# Patient Record
Sex: Female | Born: 1975 | Race: White | Hispanic: Yes | Marital: Married | Smoking: Never smoker
Health system: Southern US, Community
[De-identification: ages and names within clinical notes are randomized; demographics above are authoritative.]

---

## 2021-09-09 ENCOUNTER — Other Ambulatory Visit: Payer: Self-pay

## 2021-09-09 ENCOUNTER — Ambulatory Visit
Admission: RE | Admit: 2021-09-09 | Discharge: 2021-09-09 | Disposition: A | Discharge status: Home / Self Care | Payer: 59 | Source: Ambulatory Visit | Attending: Emergency Medicine | Admitting: Emergency Medicine

## 2021-09-09 VITALS — BP 127/78 | HR 69 | Temp 98.6°F | Resp 18

## 2021-09-09 DIAGNOSIS — R350 Frequency of micturition: Secondary | ICD-10-CM | POA: Insufficient documentation

## 2021-09-09 DIAGNOSIS — N3000 Acute cystitis without hematuria: Secondary | ICD-10-CM | POA: Diagnosis present

## 2021-09-09 LAB — POCT URINALYSIS DIP (MANUAL ENTRY)
Bilirubin, UA: NEGATIVE
Glucose, UA: NEGATIVE mg/dL
Ketones, POC UA: NEGATIVE mg/dL
Leukocytes, UA: NEGATIVE
Nitrite, UA: POSITIVE — AB
Protein Ur, POC: NEGATIVE mg/dL
Spec Grav, UA: 1.01 (ref 1.010–1.025)
Urobilinogen, UA: 0.2 E.U./dL
pH, UA: 5.5 (ref 5.0–8.0)

## 2021-09-09 MED ORDER — SULFAMETHOXAZOLE-TRIMETHOPRIM 800-160 MG PO TABS: 1.0000 | ORAL_TABLET | Freq: Two times a day (BID) | ORAL | 0 refills | Status: AC

## 2021-09-09 MED ORDER — FLUCONAZOLE 150 MG PO TABS: ORAL_TABLET | ORAL | 0 refills | Status: AC

## 2021-09-09 NOTE — ED Provider Notes (Signed)
?[facility] ? ? ? ?CSN: [id number] ?Arrival date & time: 09/09/21  1016 ?  ? ?HISTORY  ? ?Chief Complaint  ?Patient presents with  ? Urinary Frequency  ? ?HPI ?Tiffany Wheeler is a 46 y.o. female. Pt c/o normal urinary frequency with significantly decreased quantity of urine output along with weak urine stream.  Patient states she is also has some discomfort around her bladder.  Patient states she took a dose of  Phenazopyridine hydrochloride last night she did improve her urine stream a little bit but states today she still feels that her urine is dark and that she is not urinating as forcefully as normal.  Patient states she has had urinary tract infections in the past with similar initial symptoms.  Patient reports symptoms began 2 days ago.  Patient denies fever, aches, chills, nausea, vomiting, diarrhea, genital lesions, burning with urination, pelvic pain, pelvic pressure, dyspareunia, abnormal vaginal discharge. ? ? ? ?Past Medical History:  ?Diagnosis Date  ? H/O tubal ligation 2022  ? ?There are no problems to display for this patient. ? ?History reviewed. No pertinent surgical history. ?OB History   ?No obstetric history on file. ?  ? ?Home Medications   ? ?Prior to Admission medications   ?Not on File  ? ?Family History ?History reviewed. No pertinent family history. ?Social History ?Social History  ? ?Tobacco Use  ? Smoking status: Never  ? Smokeless tobacco: Never  ?Vaping Use  ? Vaping Use: Never used  ?Substance Use Topics  ? Alcohol use: Yes  ?  Comment: sometimes  ? Drug use: Never  ? ?Allergies   ?Patient has no allergy information on record. ? ?Review of Systems ?Review of Systems ?Pertinent findings noted in history of present illness.  ? ?Physical Exam ?Triage Vital Signs ?ED Triage Vitals  ?Enc Vitals Group  ?   BP 04/23/21 0827 (!) 147/82  ?   Pulse Rate 04/23/21 0827 72  ?   Resp 04/23/21 0827 18  ?   Temp 04/23/21 0827 98.3 ?F (36.8 ?C)  ?   Temp Source 04/23/21 0827 Oral  ?    SpO2 04/23/21 0827 98 %  ?   Weight --   ?   Height --   ?   Head Circumference --   ?   Peak Flow --   ?   Pain Score 04/23/21 0826 5  ?   Pain Loc --   ?   Pain Edu? --   ?   Excl. in GC? --   ?No data found. ? ?Updated Vital Signs ?BP 127/78 (BP Location: Left Arm)   Pulse 69   Temp 98.6 ?F (37 ?C) (Oral)   Resp 18   SpO2 96%  ? ?Physical Exam ?Vitals and nursing note reviewed.  ?Constitutional:   ?   General: She is not in acute distress. ?   Appearance: Normal appearance. She is not ill-appearing.  ?HENT:  ?   Head: Normocephalic and atraumatic.  ?Eyes:  ?   General: Lids are normal.     ?   Right eye: No discharge.     ?   Left eye: No discharge.  ?   Extraocular Movements: Extraocular movements intact.  ?   Conjunctiva/sclera: Conjunctivae normal.  ?   Right eye: Right conjunctiva is not injected.  ?   Left eye: Left conjunctiva is not injected.  ?Neck:  ?   Trachea: Trachea and phonation normal.  ?Cardiovascular:  ?  Rate and Rhythm: Normal rate and regular rhythm.  ?   Pulses: Normal pulses.  ?   Heart sounds: Normal heart sounds. No murmur heard. ?  No friction rub. No gallop.  ?Pulmonary:  ?   Effort: Pulmonary effort is normal. No accessory muscle usage, prolonged expiration or respiratory distress.  ?   Breath sounds: Normal breath sounds. No stridor, decreased air movement or transmitted upper airway sounds. No decreased breath sounds, wheezing, rhonchi or rales.  ?Chest:  ?   Chest wall: No tenderness.  ?Abdominal:  ?   General: Abdomen is flat. Bowel sounds are normal. There is no distension.  ?   Palpations: Abdomen is soft.  ?   Tenderness: There is abdominal tenderness in the suprapubic area. There is no right CVA tenderness or left CVA tenderness.  ?   Hernia: No hernia is present.  ?Musculoskeletal:     ?   General: Normal range of motion.  ?   Cervical back: Normal range of motion and neck supple. Normal range of motion.  ?Lymphadenopathy:  ?   Cervical: No cervical adenopathy.  ?Skin: ?    General: Skin is warm and dry.  ?   Findings: No erythema or rash.  ?Neurological:  ?   General: No focal deficit present.  ?   Mental Status: She is alert and oriented to person, place, and time.  ?Psychiatric:     ?   Mood and Affect: Mood normal.     ?   Behavior: Behavior normal.  ? ? ?Visual Acuity ?Right Eye Distance:   ?Left Eye Distance:   ?Bilateral Distance:   ? ?Right Eye Near:   ?Left Eye Near:    ?Bilateral Near:    ? ?UC Couse / Diagnostics / Procedures:  ?  ?EKG ? ?Radiology ?No results found. ? ?Procedures ?Procedures (including critical care time) ? ?UC Diagnoses / Final Clinical Impressions(s)   ?I have reviewed the triage vital signs and the nursing notes. ? ?Pertinent labs & imaging results that were available during my care of the patient were reviewed by me and considered in my medical decision making (see chart for details).   ? ?Final diagnoses:  ?Increased frequency of urination  ?Acute cystitis without hematuria  ? ? ? ?Urine dip today was positive for nitrites.  Urine culture will be performed per our protocol.   ?Patient was advised to begin antibiotics now due to findings on urine dip. ?Patient was advised to begin antibiotics today due to having active symptoms of urinary tract infection.                    ?Patient was advised to take all doses exactly as prescribed.  Patient also advised of risks of worsening infection with incomplete antibiotic therapy. ?Patient advised that they will be contacted with results and that adjustments to treatment will be provided as indicated based on the results.   Patient was advised of possibility that urine culture results may be negative if sample provided was obtained late in the day causing urine to be more diluted.  Patient was advised that if antibiotics were effective after the first 24 to 36 hours, despite negative urine culture result, it is recommended that they complete the full course as prescribed.   ?Patient advised that they will be  contacted with results of the urine culture and that treatment will be provided as indicated based on results. ?Diflucan prescribed for inevitable vaginal yeast infection caused by antibiotic  therapy. ?Return precautions advised. ? ?ED Prescriptions   ? ? Medication Sig Dispense Auth. Provider  ? sulfamethoxazole-trimethoprim (BACTRIM DS) 800-160 MG tablet Take 1 tablet by mouth 2 (two) times daily for 5 days. 10 tablet Theadora Rama Scales, PA-C  ? fluconazole (DIFLUCAN) 150 MG tablet Take 1 tablet on day 4 of antibiotics.  Take second tablet 3 days later. 2 tablet Theadora Rama Scales, PA-C  ? ?  ? ?PDMP not reviewed this encounter. ? ?Pending results:  ?Labs Reviewed  ?POCT URINALYSIS DIP (MANUAL ENTRY) - Abnormal; Notable for the following components:  ?    Result Value  ? Color, UA orange (*)   ? Blood, UA trace-lysed (*)   ? Nitrite, UA Positive (*)   ? All other components within normal limits  ?URINE CULTURE  ? ? ?Medications Ordered in UC: ?Medications - No data to display ? ?Disposition Upon Discharge:  ?Condition: stable for discharge home ? ?Patient presented with concern for an acute illness with associated systemic symptoms and significant discomfort requiring urgent management. In my opinion, this is a condition that a prudent lay person (someone who possesses an average knowledge of health and medicine) may potentially expect to result in complications if not addressed urgently such as respiratory distress, impairment of bodily function or dysfunction of bodily organs.  ? ?As such, the patient has been evaluated and assessed, work-up was performed and treatment was provided in alignment with urgent care protocols and evidence based medicine.  Patient/parent/caregiver has been advised that the patient may require follow up for further testing and/or treatment if the symptoms continue in spite of treatment, as clinically indicated and appropriate. ? ?Routine symptom specific, illness specific  and/or disease specific instructions were discussed with the patient and/or caregiver at length.  Prevention strategies for avoiding STD exposure were also discussed. ? ?The patient will follow up with their c

## 2021-09-09 NOTE — Discharge Instructions (Addendum)
El an?lisis de orina que realizamos en la cl?nica hoy fue anormal. El cultivo de orina se Education officer, environmental? seg?n Science writer. El resultado del urocultivo estar? disponible en los pr?ximos 3 a 5 d?as y se publicar? en su cuenta MyChart. Si hay un hallazgo anormal, se comunicar? con usted por tel?fono y se le informar? sobre recomendaciones de tratamiento adicionales, si corresponde.  ? ?Se le aconsej? que comenzara con los antibi?ticos hoy porque tiene s?ntomas activos de una infecci?n del tracto urinario. He enviado una receta para Bactrim, un antibi?tico com?nmente utilizado para tratar infecciones del tracto urinario. Tome 1 tableta dos veces al d?a durante 5 d?as completos.  ? ?Es muy importante que tome todas las dosis exactamente seg?n lo prescrito. La terapia antibi?tica incompleta puede causar el empeoramiento de la infecci?n del tracto urinario que puede volverse agresiva, Barista el nivel de los ri?ones causando infecci?n renal y posible hospitalizaci?n.  ? ?Si recibe una llamada telef?nica que le informa que su cultivo de orina es negativo pero se siente significativamente mejor despu?s de Games developer con los antibi?ticos, le recomiendo que complete el ciclo completo de antibi?ticos seg?n lo prescrito.  ? ?Si recibe una llamada telef?nica que le informa que su cultivo de orina es negativo y que no se siente significativamente mejor despu?s de Games developer con los antibi?ticos, no dude en suspenderlos, ya que ya no est?n indicados.  ? ?Debido a que sabemos que el tratamiento con antibi?ticos a menudo puede causar infecciones vaginales por hongos, tambi?n le he proporcionado una receta para fluconazol (Diflucan). Tome la primera tableta el tercer d?a de sus antibi?ticos y tome la segunda tableta dos d?as despu?s de la primera tableta.  ? ?Si no ha tenido Training and development officer de sus s?ntomas despu?s de Advertising account planner seg?n lo prescrito, regrese a la atenci?n de urgencia para repetir la evaluaci?n o el  seguimiento con su proveedor de atenci?n primaria.  ? ?Gracias por visitar la atenci?n de urgencia hoy. Agradezco la oportunidad de participar en su atenci?n.  ? ? ? ?The urinalysis that we performed in the clinic today was abnormal.  Urine culture will be performed per our protocol.  The result of the urine culture will be available in the next 3 to 5 days and will be posted to your MyChart account.  If there is an abnormal finding, you will be contacted by phone and advised of further treatment recommendations, if any. ?  ?You were advised to begin antibiotics today because you are having active symptoms of a urinary tract infection.  I have sent a prescription for Bactrim, an antibiotic commonly used to treat urinary tract infections.  Please take 1 tablet twice daily for a full 5 days.   ? ?It is very important that you take all doses exactly as prescribed.  Incomplete antibiotic therapy can cause worsening urinary tract infection that can become aggressive, reach the level of your kidneys causing kidney infection and possible hospitalization. ?  ?If you receive a phone call advising you that your urine culture is negative but you are feeling significantly better after starting antibiotics, I recommend that you complete the full course of antibiotics as prescribed.   ?  ?If you receive a phone call advising you that your urine culture is negative and you are not feeling significantly better after starting antibiotics, please feel free to discontinue antibiotics as they are no longer indicated. ?  ?Because we know that antibiotic treatment can often cause vaginal yeast infections, I have also provided you with  a prescription for fluconazole (Diflucan).  Please take the first tablet on the third day of your antibiotics and take the second tablet two days after the first tablet. ?  ?If you have not had complete resolution of your symptoms after completing treatment as prescribed, please return to urgent care for  repeat evaluation or follow-up with your primary care provider. ?  ?Thank you for visiting urgent care today.  I appreciate the opportunity to participate in your care. ? ?

## 2021-09-09 NOTE — ED Triage Notes (Addendum)
Pt c/o urinary frequency with small amounts, and bladder pain. Patient reports taking a dose of Phenazopyridine hydrochloride last night. ?Started: 2 days ago ?

## 2021-09-10 LAB — URINE CULTURE: Culture: NO GROWTH

## 2021-09-30 ENCOUNTER — Telehealth: Payer: 59 | Admitting: Physician Assistant

## 2021-09-30 DIAGNOSIS — M722 Plantar fascial fibromatosis: Secondary | ICD-10-CM

## 2021-09-30 MED ORDER — METHYLPREDNISOLONE 4 MG PO TBPK: ORAL_TABLET | ORAL | 0 refills | Status: AC

## 2021-09-30 NOTE — Progress Notes (Signed)
?Virtual Visit Consent  ? ?Tiffany Wheeler, you are scheduled for a virtual visit with a [facility] provider today.   ?  ?Just as with appointments in the office, your consent must be obtained to participate.  Your consent will be active for this visit and any virtual visit you may have with one of our providers in the next 365 days.   ?  ?If you have a MyChart account, a copy of this consent can be sent to you electronically.  All virtual visits are billed to your insurance company just like a traditional visit in the office.   ? ?As this is a virtual visit, video technology does not allow for your provider to perform a traditional examination.  This may limit your provider's ability to fully assess your condition.  If your provider identifies any concerns that need to be evaluated in person or the need to arrange testing (such as labs, EKG, etc.), we will make arrangements to do so.   ?  ?Although advances in technology are sophisticated, we cannot ensure that it will always work on either your end or our end.  If the connection with a video visit is poor, the visit may have to be switched to a telephone visit.  With either a video or telephone visit, we are not always able to ensure that we have a secure connection.    ? ?I need to obtain your verbal consent now.   Are you willing to proceed with your visit today?  ?  ?Tiffany Wheeler has provided verbal consent on 09/30/2021 for a virtual visit (video or telephone). ?  ?Margaretann Loveless, PA-C  ? ?Date: 09/30/2021 5:22 PM ? ? ?Virtual Visit via Video Note  ? ?Tiffany Wheeler, connected with  Cherl Gorney  ([id number], Sep 18, 1975) on 09/30/21 at  5:00 PM EDT by a video-enabled telemedicine application and verified that I am speaking with the correct person using two identifiers. ? ?Location: ?Patient: Virtual Visit Location Patient: Home ?Provider: Virtual Visit Location Provider: Home Office ?Spanish Intepreter [phone] ? ?  ?I discussed  the limitations of evaluation and management by telemedicine and the availability of in person appointments. The patient expressed understanding and agreed to proceed.   ? ?History of Present Illness: ?Tiffany Wheeler is a 46 y.o. who identifies as a female who was assigned female at birth, and is being seen today for having pain in right plantar fascia. Has had a long time, over 2 years. Has had cortisone injection that helped about 60%. Now having more pain. Feels heel is going to break when she puts weight on the foot.  Altering gait enough that she is starting to have knee pain. Was prescribed Ibuprofen that is not helping as much. Has tried naprosyn, acetaminophen. Has tried orthotics as well. Last seen podiatry in 04/2021, but has had insurance change and requires a new referral.  ? ? ?Problems: There are no problems to display for this patient. ?  ?Allergies: No Known Allergies ?Medications:  ?Current Outpatient Medications:  ?  methylPREDNISolone (MEDROL DOSEPAK) 4 MG TBPK tablet, 6 day taper; Take as directed on package instructions, Disp: 21 tablet, Rfl: 0 ?  fluconazole (DIFLUCAN) 150 MG tablet, Take 1 tablet on day 4 of antibiotics.  Take second tablet 3 days later., Disp: 2 tablet, Rfl: 0 ? ?Observations/Objective: ?Patient is well-developed, well-nourished in no acute distress.  ?Resting comfortably at home.  ?Head is normocephalic, atraumatic.  ?No labored breathing.  ?  Speech is clear and coherent with logical content.  ?Patient is alert and oriented at baseline.  ? ? ?Assessment and Plan: ?1. Plantar fasciitis of right foot ?- methylPREDNISolone (MEDROL DOSEPAK) 4 MG TBPK tablet; 6 day taper; Take as directed on package instructions  Dispense: 21 tablet; Refill: 0 ? ?- Will give medrol for pain and inflammation ?- Continue to use orthotic for shoe ?- Advised we were unable to place referrals through our department and this would need to be done by her PCP ? ?Follow Up Instructions: ?I discussed  the assessment and treatment plan with the patient. The patient was provided an opportunity to ask questions and all were answered. The patient agreed with the plan and demonstrated an understanding of the instructions.  A copy of instructions were sent to the patient via MyChart unless otherwise noted below.  ? ? ?The patient was advised to call back or seek an in-person evaluation if the symptoms worsen or if the condition fails to improve as anticipated. ? ?Time:  ?I spent 25 minutes with the patient via telehealth technology discussing the above problems/concerns.   ? ?Margaretann Loveless, PA-C ?

## 2021-09-30 NOTE — Patient Instructions (Signed)
?Wynonia Hazard, thank you for joining Margaretann Loveless, PA-C for today's virtual visit.  While this provider is not your primary care provider (PCP), if your PCP is located in our provider database this encounter information will be shared with them immediately following your visit. ? ?Consent: ?(Patient) Tiffany Wheeler provided verbal consent for this virtual visit at the beginning of the encounter. ? ?Current Medications: ? ?Current Outpatient Medications:  ?  methylPREDNISolone (MEDROL DOSEPAK) 4 MG TBPK tablet, 6 day taper; Take as directed on package instructions, Disp: 21 tablet, Rfl: 0 ?  fluconazole (DIFLUCAN) 150 MG tablet, Take 1 tablet on day 4 of antibiotics.  Take second tablet 3 days later., Disp: 2 tablet, Rfl: 0  ? ?Medications ordered in this encounter:  ?Meds ordered this encounter  ?Medications  ? methylPREDNISolone (MEDROL DOSEPAK) 4 MG TBPK tablet  ?  Sig: 6 day taper; Take as directed on package instructions  ?  Dispense:  21 tablet  ?  Refill:  0  ?  Order Specific Question:   Supervising Provider  ?  Answer:   Eber Hong [[id number]]  ?  ? ?*If you need refills on other medications prior to your next appointment, please contact your pharmacy* ? ?Follow-Up: ?Call back or seek an in-person evaluation if the symptoms worsen or if the condition fails to improve as anticipated. ? ?Other Instructions ? ?Fascitis plantar ?Plantar Fasciitis ?La fascitis plantar es una afecci?n dolorosa que se produce en el tal?n. Ocurre cuando la banda de tejido que [employer] dedos con el hueso del tal?n (fascia plantar) se irrita. Esto puede ocurrir por Academic librarian ejercicio u otras actividades repetitivas (lesi?n por uso excesivo). ?La fascitis plantar puede causar desde una leve irritaci?n hasta dolor intenso que dificulta que la persona camine o se [place]. Por lo general, el dolor es peor a la ma?Zayra despu?s de dormir, o despu?s de permanecer sentado o acostado durante un per?odo de [place]. El dolor  tambi?n puede empeorar despu?s de caminar o estar de pie por [employer]. ??Cu?les son las causas? ?Esta afecci?n puede ser causada por lo siguiente: ?Estar de pie durante largos per?odos. ?Usar zapatos que no tengan un buen soporte para el arco. ?Realizar actividades que implican esfuerzo para las articulaciones (actividades de alto impacto). Esto incluye el ballet y la actividad f?sica que hace que el coraz?n lata m?s r?pido (ejercicio aer?bico), como correr. ?Tener sobrepeso. ?Tener una forma de caminar (andar) anormal. ?Presentar rigidez muscular en la parte posterior de la parte inferior de la pierna (pantorrilla). ?Arcos Google o pies planos. ?Comenzar una nueva actividad f?sica. ??Cu?les son los signos o s?ntomas? ?El s?ntoma principal de esta afecci?n es el dolor en el tal?n. El dolor puede empeorar despu?s de lo siguiente: ?Con los primeros pasos luego de estar en reposo, especialmente por la ma?Jovon despu?s de dormir o de haber estado sentado o acostado durante un [place]. ?Largos per?odos de Personal assistant de pie. ?El dolor puede disminuir despu?s de 30 a 45 minutos de [place], como caminar apaciblemente. ??C?mo se diagnostica? ?Esta afecci?n se puede diagnosticar en funci?n de los antecedentes m?dicos, un examen f?sico y los s?ntomas. El m?dico controlar? lo siguiente: ?Un ?rea dolorida en la parte inferior del pie. ?Arco alto en el pie o pies planos. ?Dolor al [employer]. ?Dificultad para mover el pie. ?Pueden realizarle estudios de diagn?stico por imagen para confirmar el diagn?stico, por ejemplo: ?Radiograf?as. ?Ecograf?a. ?Resonancia magn?tica (RM). ??C?mo se trata? ?El tratamiento de la fascitis  plantar depende de la gravedad de su afecci?n. El tratamiento puede incluir: ?Reposo, hielo, presi?n (compresi?n) y Lexicographerlevantar (elevar) el pie afectado. Esto se denomina tratamiento de RHCE (reposo, hielo, compresi?n, elevaci?n). El m?dico puede recomendarle terapia de RHCE junto con medicamentos de  venta libre para Engineer, materialsaliviar el dolor. ?Ejercicios para estirar las pantorrillas y la fascia plantar. ?Una f?rula que UGI Corporationmantiene el pie estirado y [place] arriba mientras usted duerme (f?rula nocturna). ?Fisioterapia para Paramedicaliviar los s?ntomas y Physiological scientistevitar problemas en el futuro. ?Inyecciones de medicamentos con corticoesteroides (cortisona) para Engineer, materialsaliviar el dolor y la inflamaci?n. ?Estimular su fascia plantar lesionada con impulsos el?ctricos (tratamiento con ondas de choque extracorp?reas). Esto generalmente es la ?ltima opci?n de tratamiento antes de la cirug?a. ?Cirug?a, si los otros tratamientos no han funcionado despu?s de 12 meses. ?Siga estas instrucciones en su casa: ?Control del dolor, la rigidez y la hinchaz?n ? ?Si se lo indican, aplique hielo sobre la zona dolorida. Para hacer esto: ?Ponga el hielo en una bolsa de pl?stico o use una botella de agua congelada. ?Coloque una FirstEnergy Corptoalla entre la piel y la bolsa de hielo o la botella. ?Frote la parte inferior del pie sobre la bolsa o la botella. ?Haga esto durante 20 minutos, de 2 a 3 veces al d?a. ?Use calzado deportivo con amortiguaci?n de aire o gel, o pruebe usar plantillas blandas dise?adas para la fascitis plantar. ?Cuando est? sentado o acostado, eleve el pie por encima del nivel del coraz?n. ?Actividad ?Evite las SUPERVALU INCactividades que le causan dolor. Preg?ntele al m?dico qu? actividades son seguras para usted. ?Haga los ejercicios de fisioterapia y estiramiento como se lo haya indicado el m?dico. ?Intente hacer actividades y tipos de ejercicio que sean m?s suaves para las articulaciones (de bajo impacto). Por ejemplo, nadar, hacer ejercicios aer?bicos en el agua, y andar en bicicleta. ?Instrucciones generales ?Use los medicamentos de venta libre y los recetados solamente como se lo haya indicado el m?dico. ?Si el m?dico se lo indica, use una f?rula nocturna para dormir. Afloje la f?rula si los dedos de los pies se le entumecen, siente hormigueos o se le enfr?an y se tornan  de Research officer, trade unioncolor azul. ?Mantenga un peso saludable, o colabore con su m?dico para perder The PNC Financialpeso. ?Cumpla con todas las visitas de seguimiento. Esto es importante. ?Comun?quese con un m?dico si tiene: ?S?ntomas que no desaparecen con el tratamiento en casa. ?Dolor que Cendant Corporationempeora. ?Dolor que afecta su capacidad de moverse o de Education officer, environmentalrealizar sus actividades diarias. ?Resumen ?La fascitis plantar es una afecci?n dolorosa que se produce en el tal?n. Ocurre cuando la banda de tejido que AT&Tconecta los dedos con el hueso del tal?n (fascia plantar) se irrita. ?El dolor en el tal?n es el s?ntoma principal de esta afecci?n. Puede empeorar despu?s de Research scientist (medical)hacer demasiado ejercicio o de Personal assistantpermanecer quieto de pie durante [place]o tiempo. ?El tratamiento var?a, pero normalmente comienza con el reposo, la aplicaci?n de hielo, la aplicaci?n de presi?n (compresi?n) y la elevaci?n del pie afectado. Esto se denomina tratamiento de RHCE (reposo, hielo, compresi?n, elevaci?n). Tambi?n se pueden usar analg?sicos de venta libre para Human resources officercontrolar el dolor. ?Esta informaci?n no tiene Theme park managercomo fin reemplazar el consejo del m?dico. Aseg?rese de hacerle al m?dico cualquier pregunta que tenga. ?Document Revised: 12/16/2019 Document Reviewed: 12/16/2019 ?Elsevier Patient Education ? 2022 Elsevier Inc. ? ? ? ?If you have been instructed to have an in-person evaluation today at a local Urgent Care facility, please use the link below. It will take you to a list of all of our available [facility], including address, phone number and hours of operation. Please do not delay care.  ?[facility] ? ?If you or a family member do not have a primary care provider, use the link below to schedule a visit and establish care. When you choose a [facility] primary care physician or advanced practice provider, you gain a long-term partner in health. ?Find a Primary Care Provider ? ?Learn more about [facility]'s in-office and virtual care options: ?[facility] - Get Care  Now ?

## 2021-10-07 ENCOUNTER — Other Ambulatory Visit (HOSPITAL_BASED_OUTPATIENT_CLINIC_OR_DEPARTMENT_OTHER): Payer: Self-pay | Admitting: Orthopaedic Surgery

## 2021-10-07 ENCOUNTER — Ambulatory Visit (HOSPITAL_BASED_OUTPATIENT_CLINIC_OR_DEPARTMENT_OTHER)
Admission: RE | Admit: 2021-10-07 | Discharge: 2021-10-07 | Disposition: A | Discharge status: Home / Self Care | Payer: 59 | Source: Ambulatory Visit | Attending: Orthopaedic Surgery | Admitting: Orthopaedic Surgery

## 2021-10-07 ENCOUNTER — Ambulatory Visit (INDEPENDENT_AMBULATORY_CARE_PROVIDER_SITE_OTHER): Payer: 59 | Admitting: Orthopaedic Surgery

## 2021-10-07 DIAGNOSIS — M722 Plantar fascial fibromatosis: Secondary | ICD-10-CM | POA: Diagnosis not present

## 2021-10-07 DIAGNOSIS — M79671 Pain in right foot: Secondary | ICD-10-CM | POA: Diagnosis present

## 2021-10-07 NOTE — Progress Notes (Signed)
? ?                            ? ? ?Chief Complaint: Presents today for right foot pain ?  ? ? ?History of Present Illness:  ? ? ?Tiffany Wheeler is a 46 y.o. female with right foot pain which has been ongoing now for over 2+ years.  She states that she has previously been diagnosed with plantar fasciitis.  She has previously undergone an injection several months ago.  She does state that this did help her significantly.  She takes ibuprofen intermittently for pain as this helps her.  She has a home stretching program as well as uses a frozen water bottle with some relief.  She states that her injection has unfortunately worn off.  She works in a [employer].  Here today for further assessment.  She has not completed formal physical therapy ? ? ? ?Surgical History:   ?None ? ?PMH/PSH/Family History/Social History/Meds/Allergies:   ? ?Past Medical History:  ?Diagnosis Date  ? H/O tubal ligation 2022  ? ?No past surgical history on file. ?Social History  ? ?Socioeconomic History  ? Marital status: Married  ?  Spouse name: Not on file  ? Number of children: Not on file  ? Years of education: Not on file  ? Highest education level: Not on file  ?Occupational History  ? Not on file  ?Tobacco Use  ? Smoking status: Never  ? Smokeless tobacco: Never  ?Vaping Use  ? Vaping Use: Never used  ?Substance and Sexual Activity  ? Alcohol use: Yes  ?  Comment: sometimes  ? Drug use: Never  ? Sexual activity: Not on file  ?  Comment: tubal ligation 10/13/2020  ?Other Topics Concern  ? Not on file  ?Social History Narrative  ? Not on file  ? ?Social Determinants of Health  ? ?Financial Resource Strain: Not on file  ?Food Insecurity: Not on file  ?Transportation Needs: Not on file  ?Physical Activity: Not on file  ?Stress: Not on file  ?Social Connections: Not on file  ? ?No family history on file. ?No Known Allergies ?Current Outpatient Medications  ?Medication Sig Dispense Refill  ? fluconazole (DIFLUCAN) 150 MG tablet Take  1 tablet on day 4 of antibiotics.  Take second tablet 3 days later. 2 tablet 0  ? methylPREDNISolone (MEDROL DOSEPAK) 4 MG TBPK tablet 6 day taper; Take as directed on package instructions 21 tablet 0  ? ?No current facility-administered medications for this visit.  ? ?No results found. ? ?Review of Systems:   ?A ROS was performed including pertinent positives and negatives as documented in the HPI. ? ?Physical Exam :   ?Constitutional: NAD and appears stated age ?Neurological: Alert and oriented ?Psych: Appropriate affect and cooperative ?There were no vitals taken for this visit.  ? ?Comprehensive Musculoskeletal Exam:   ? ?Tender to palpation about the calcaneal tuberosity medially particularly.  Otherwise she has 50 degrees of dorsiflexion on the right compared to 25 on the left.  She has pain with resisted plantarflexion.  Distal sensory neuro exam is intact ? ?Imaging:   ?Xray (3 views right foot): ?Normal ? ? ?I personally reviewed and interpreted the radiographs. ? ? ?Assessment:   ?46 y.o. female with right foot pain consistent with ongoing plantar fasciitis.  We had a long discussion today about the core etiology including a tight in the plantar fascia and Achilles complex.  At this time I would like to recommend a plantar fascia stretching program and formal physical therapy to work on her with this.  I did discuss that she could also perform dry needling in this area to help with this.  I specifically have also recommended a foot stretching device as well as a gel heel cup which she can use with while at work.  I will plan to see her back if no relief in 6 weeks ? ?Plan :   ? ?-Physical therapy ordered for a Achilles stretching as well as plantar fascia stretching program, possible candidate for dry needling and aqua therapy ? ? ? ? ?I personally saw and evaluated the patient, and participated in the management and treatment plan. ? ?Huel Cote, MD ?Attending Physician, Orthopedic Surgery ? ?This  document was dictated using Conservation officer, historic buildings. A reasonable attempt at proof reading has been made to minimize errors. ?

## 2021-11-03 NOTE — Therapy (Addendum)
OUTPATIENT PHYSICAL THERAPY LOWER EXTREMITY EVALUATION   Patient Name: Tiffany Wheeler MRN: [id number] DOB:[birth year], 46 y.o., female Today's Date: 11/04/2021   PT End of Session - 11/04/21 0953     Visit Number 1    Number of Visits 12    Date for PT Re-Evaluation 12/16/21    Authorization Type [employer]    PT Start Time [phone]    PT Stop Time 0945    PT Time Calculation (min) 47 min    Activity Tolerance Patient tolerated treatment well    Behavior During Therapy [facility] for tasks assessed/performed             Past Medical History:  Diagnosis Date   H/O tubal ligation 2022   History reviewed. No pertinent surgical history. There are no problems to display for this patient.   REFERRING PROVIDER: Vanetta Mulders, MD   REFERRING DIAG: M72.2 (ICD-10-CM) - Plantar fasciitis of right foot   THERAPY DIAG:  Pain in right foot  ONSET DATE: Approx 2 years ago onset ; MD script 10/07/2021  SUBJECTIVE:   SUBJECTIVE STATEMENT: Interpreter used virtual t/o evaluation. Pt reports having pain for possibly 2 years with no specific MOI.  She received an injection last year which did help a little bit though reports her pain returned in 1 week.  Pt has not received PT.  Pt was prescribed a medrol dosepak in April though did not fill the prescription. Pt saw MD and had x rays.  MD ordered PT and MD script indicated R achilles stretching and plantar fascia stretching.  Possible candidate for DN.  Pt received insoles to put in her work shoes.  She bought some heel cups for her shoes.  Pt reports improved sx's with insoles and heel cups.  Pt states she has been doing some stretching and using a frozen water bottle to roll bottom of foot.  Pt has pain 1st thing in AM.  Pt has increased pain with standing at work and stands her entire work shifts.  Pt does report improved pain with insoles.  Pain with standing up after sitting for a prolonged time.  Pt has increased pain ambulating  barefooted.  Pt has stopped jogging due to pain.  Pt has disturbed sleep due to pain.    PERTINENT HISTORY: Interpreter needed.  Chronic pain  PAIN:  Intensity:  current:  5-6/10  best: 1-2/10 worst: 8-9/10 Location:  R heel  PRECAUTIONS: Other: chronic pain  WEIGHT BEARING RESTRICTIONS No  FALLS:  Has patient fallen in last 6 months? 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  PLOF: Independent; pt reports having pain for possibly 2 years which has affected her work and standing activities.   PATIENT GOALS to improve pain   OBJECTIVE:   DIAGNOSTIC FINDINGS:  X rays: IMPRESSION: Tiny plantar calcaneal heel spur. Otherwise, normal right foot radiographs.  PATIENT SURVEYS:  FOTO Will give next visit  COGNITION:  Overall cognitive status: Within functional limits for tasks assessed       PALPATION: TTP: R heel  LE ROM:  Active ROM Right 11/04/2021 Left 11/04/2021  Hip flexion    Hip extension    Hip abduction    Hip adduction    Hip internal rotation    Hip external rotation    Knee flexion    Knee extension    Ankle dorsiflexion 19 18  Ankle plantarflexion 67 65  Ankle  inversion 32 32  Ankle eversion 8/14 12   (Blank rows = not tested)  LE MMT:  MMT Right 11/04/2021 Left 11/04/2021  Hip flexion    Hip extension    Hip abduction    Hip adduction    Hip internal rotation    Hip external rotation    Knee flexion    Knee extension    Ankle dorsiflexion 5/5 5/5  Ankle plantarflexion WFL tested in sitting WFL  Tested in sitting  Ankle inversion 5/5 5/5  Ankle eversion 5/5 5/5   (Blank rows = not tested)   GAIT: Assistive device utilized: None Level of assistance: Complete Independence Comments: Pt ambulates with a normalized heel to toe gait without limping.     TODAY'S TREATMENT: Pt performed plantar fascia stretch 2x30 sec and standing gastroc stretch on wall 2x20-30 sec.  Pt  educated in using a ball and/or frozen water bottle to roll plantar fascia.  Pt received a HEP handout and was educated in correct form and appropriate frequency.  Pt instructed to not perform stretches into a painful range.  See below for pt education.    PATIENT EDUCATION:  Education details:  HEP, dx, relevant anatomy, prognosis, objective findings, and POC.  Person educated: Patient Education method: Explanation, Demonstration, Tactile cues, Verbal cues, and Handouts Education comprehension: verbalized understanding, returned demonstration, verbal cues required, tactile cues required, and needs further education   HOME EXERCISE PROGRAM: Access Code: [email or web address] URL: [email or web address] Date: 11/04/2021 Prepared by: Ronny Flurry  Exercises - Seated Plantar Fascia Stretch  - 2-3 x daily - 7 x weekly - 2-3 reps - 20-30 seconds hold - Gastroc Stretch on Wall  - 2 x daily - 7 x weekly - 2 reps - 20-30 seconds hold - Seated Plantar Fascia Mobilization with Small Ball  - 2 x daily - 7 x weekly   ASSESSMENT:  CLINICAL IMPRESSION: Patient is a 46 y.o. female with a dx of R plantar fasciitis presenting to the clinic with R heel pain and tenderness with palpating R heel.  Pt has signs and sx's consistent with dx including pain 1st thing in AM and also standing up after sitting for a prolonged time.  Pt has increased pain with standing at work which she does during her entire shifts.  Pt reports improved pain with insoles and heel cups.  She has stopped jogging and has disturbed sleep due to pain.  Pt has good strength and AROM t/o R ankle except some limitation in eversion ROM.  Pt should benefit from skilled PT services to address impairments and to improve overall function.   OBJECTIVE IMPAIRMENTS decreased activity tolerance, decreased mobility, decreased strength, hypomobility, increased fascial restrictions, impaired flexibility, and pain.   ACTIVITY LIMITATIONS occupation  and standing and unable to jog .   PERSONAL FACTORS Time since onset of injury/illness/exacerbation are also affecting patient's functional outcome.    REHAB POTENTIAL: Good  CLINICAL DECISION MAKING: Stable/uncomplicated  EVALUATION COMPLEXITY: Low   GOALS:   SHORT TERM GOALS: Target date: 11/25/2021  Pt will be independent and compliant with HEP for improved pain, ROM, strength, and function. Baseline: Goal status: INITIAL  2.  Pt will report at least a 25% improvement in pain and sx's overall.  Baseline:  Goal status: INITIAL  3.  Pt will report she is able to sleep at least 5/7 nights per week without pain waking her up.  Baseline:  Goal status: INITIAL   LONG TERM GOALS: Target date: 12/16/2021  Pt will  be able to perform her normal work activities without significant pain. Baseline:  Goal status: INITIAL  2.  Pt will report at least a 70% improvement in pain 1st thing in AM.  Baseline:  Goal status: INITIAL   PLAN: PT FREQUENCY: 2x/week  PT DURATION: 6 weeks  PLANNED INTERVENTIONS: Therapeutic exercises, Therapeutic activity, Neuromuscular re-education, Gait training, Patient/Family education, Joint mobilization, Stair training, Aquatic Therapy, Dry Needling, Electrical stimulation, Cryotherapy, Moist heat, Taping, Ultrasound, Ionotophoresis 4mg /ml Dexamethasone, and Manual therapy  PLAN FOR NEXT SESSION: Review and perform HEP.  STM/IASTM to plantar fascia and heel.  Calf and plantar fascia stretching.  Foot intrinsic strengthening.  MD script indicated possible dry needling and pt expressed interest in dry needling.  Educate pt to not walk barefooted   Selinda Michaels III PT, DPT 11/04/21 9:33 PM  PHYSICAL THERAPY DISCHARGE SUMMARY  Visits from Start of Care: 1  Current functional level related to goals / functional outcomes: Unable to assess function and goals due to pt not being present at discharge.    Remaining deficits: See above   Education /  Equipment: Pt has a HEP.   Patient was seen in PT for 1 visit on Nov 04, 2021.  Patient canceled her follow-up appointment on June 1 due to feeling better.  She will be considered discharged at this time due to not scheduling any further appt's.    Selinda Michaels III PT, DPT 05/19/22 7:34 AM

## 2021-11-04 ENCOUNTER — Ambulatory Visit (HOSPITAL_BASED_OUTPATIENT_CLINIC_OR_DEPARTMENT_OTHER): Payer: 59 | Attending: Orthopaedic Surgery | Admitting: Physical Therapy

## 2021-11-04 ENCOUNTER — Encounter (HOSPITAL_BASED_OUTPATIENT_CLINIC_OR_DEPARTMENT_OTHER): Payer: Self-pay | Admitting: Physical Therapy

## 2021-11-04 DIAGNOSIS — M79671 Pain in right foot: Secondary | ICD-10-CM | POA: Diagnosis not present

## 2021-11-04 DIAGNOSIS — M722 Plantar fascial fibromatosis: Secondary | ICD-10-CM | POA: Diagnosis present

## 2021-11-25 ENCOUNTER — Encounter (HOSPITAL_BASED_OUTPATIENT_CLINIC_OR_DEPARTMENT_OTHER): Payer: 59 | Admitting: Physical Therapy

## 2023-05-17 IMAGING — DX DG FOOT COMPLETE 3+V*R*
3 series · 3 of 3 positions shown · non-contrast
Comparison: None

CLINICAL DATA: Right foot pain at heel/arch of foot x 2 years.

EXAM:
RIGHT FOOT COMPLETE - 3+ VIEW

[foot ap]
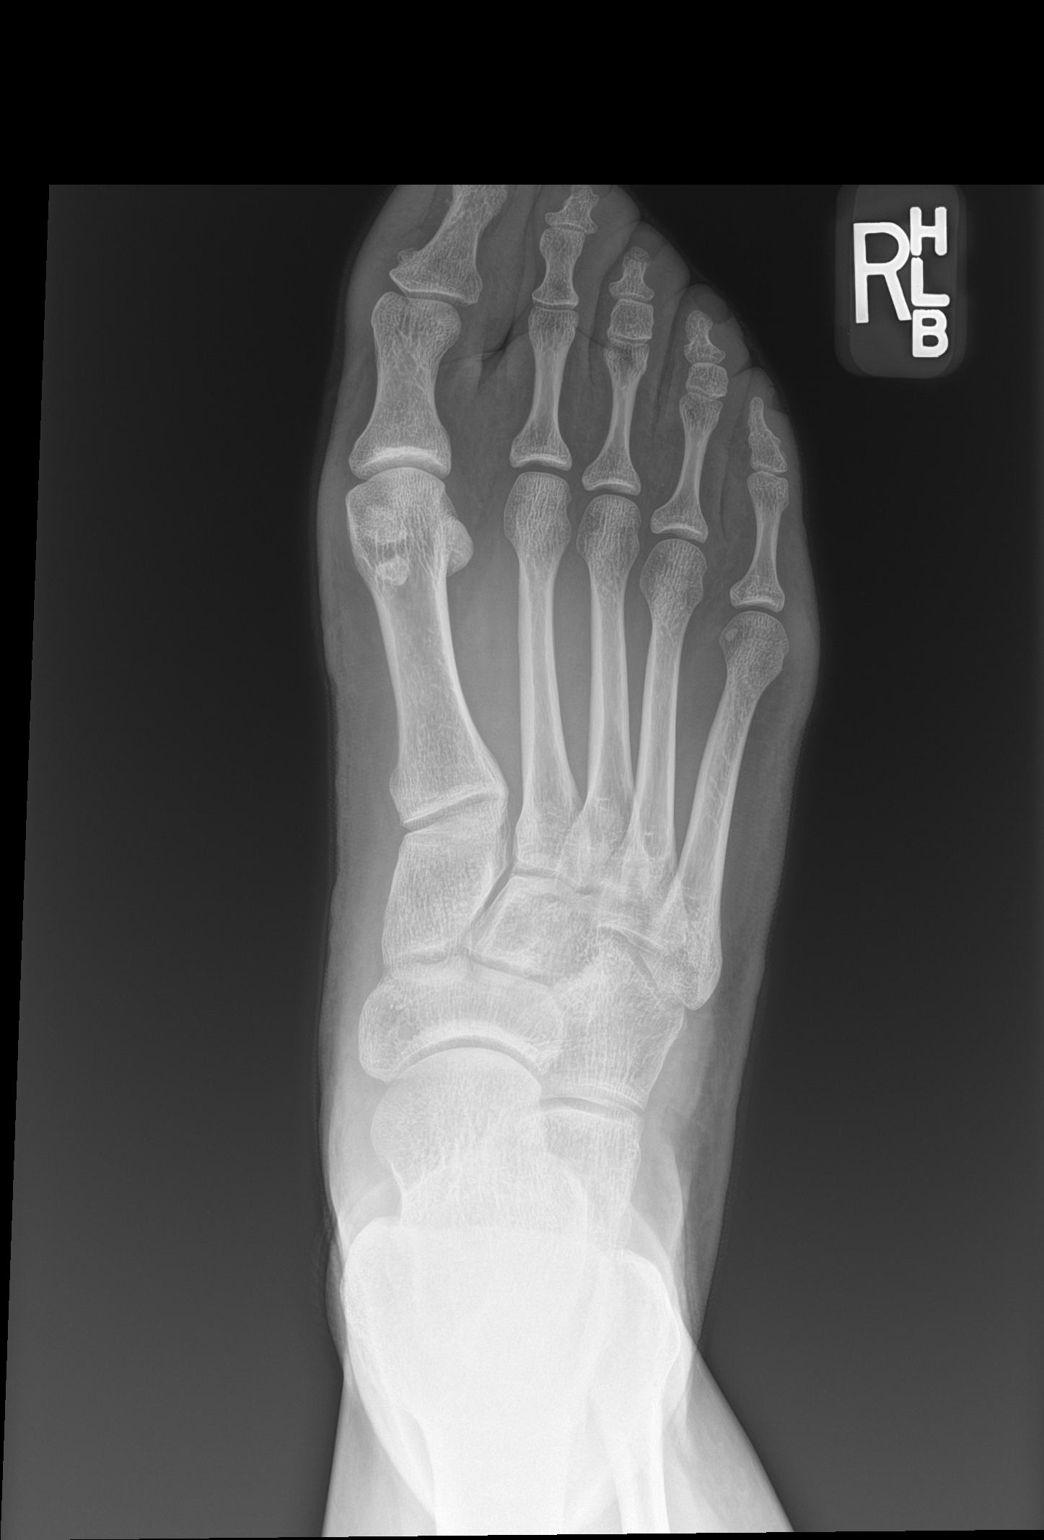

[foot obl]
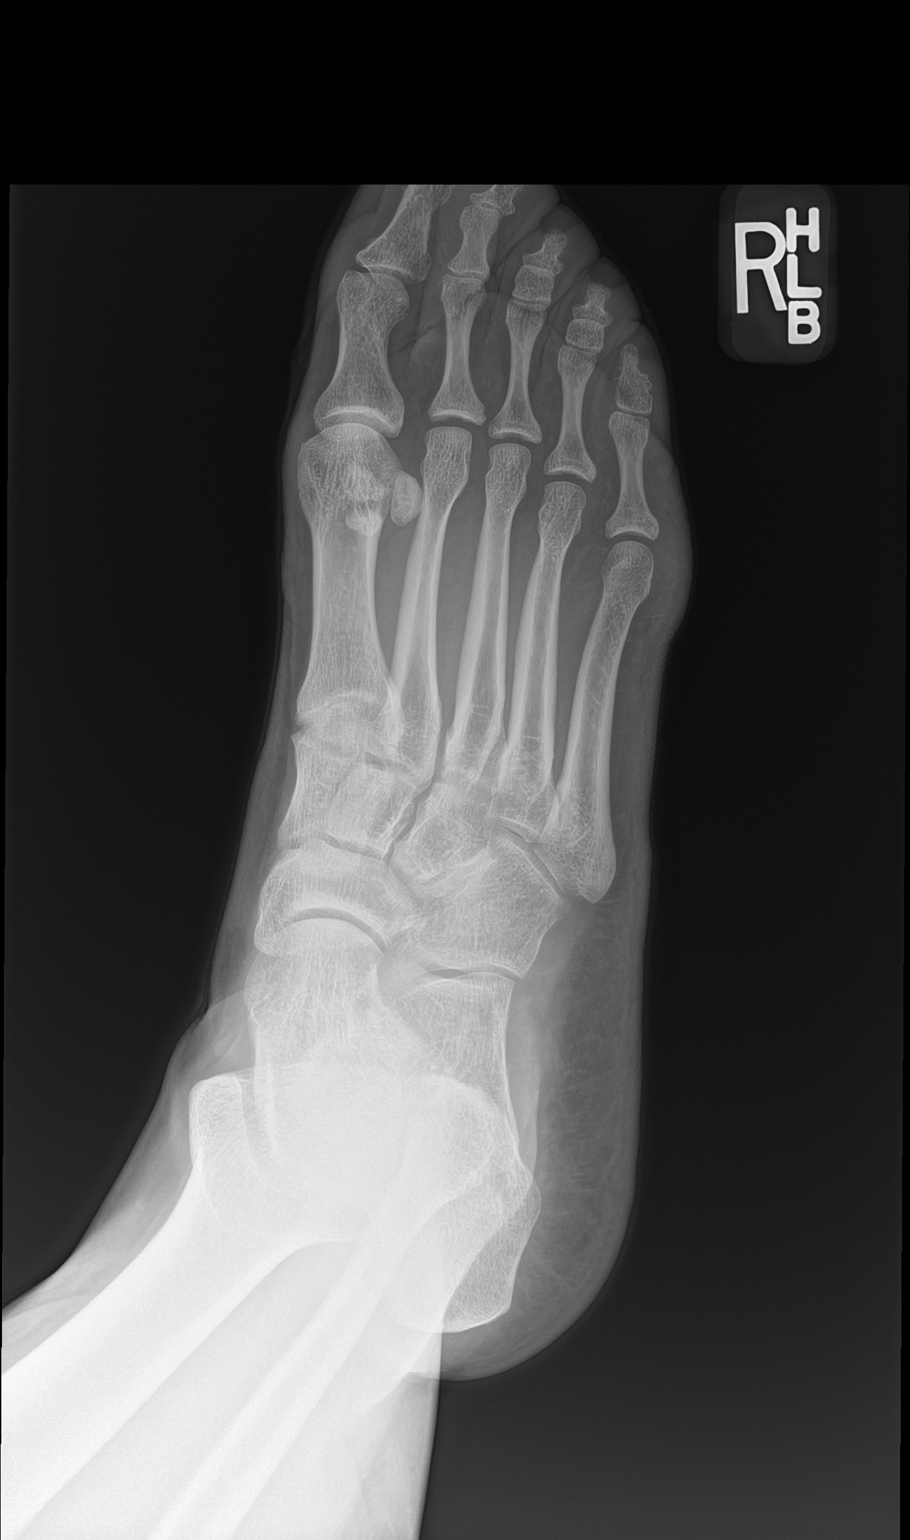

[foot lat]
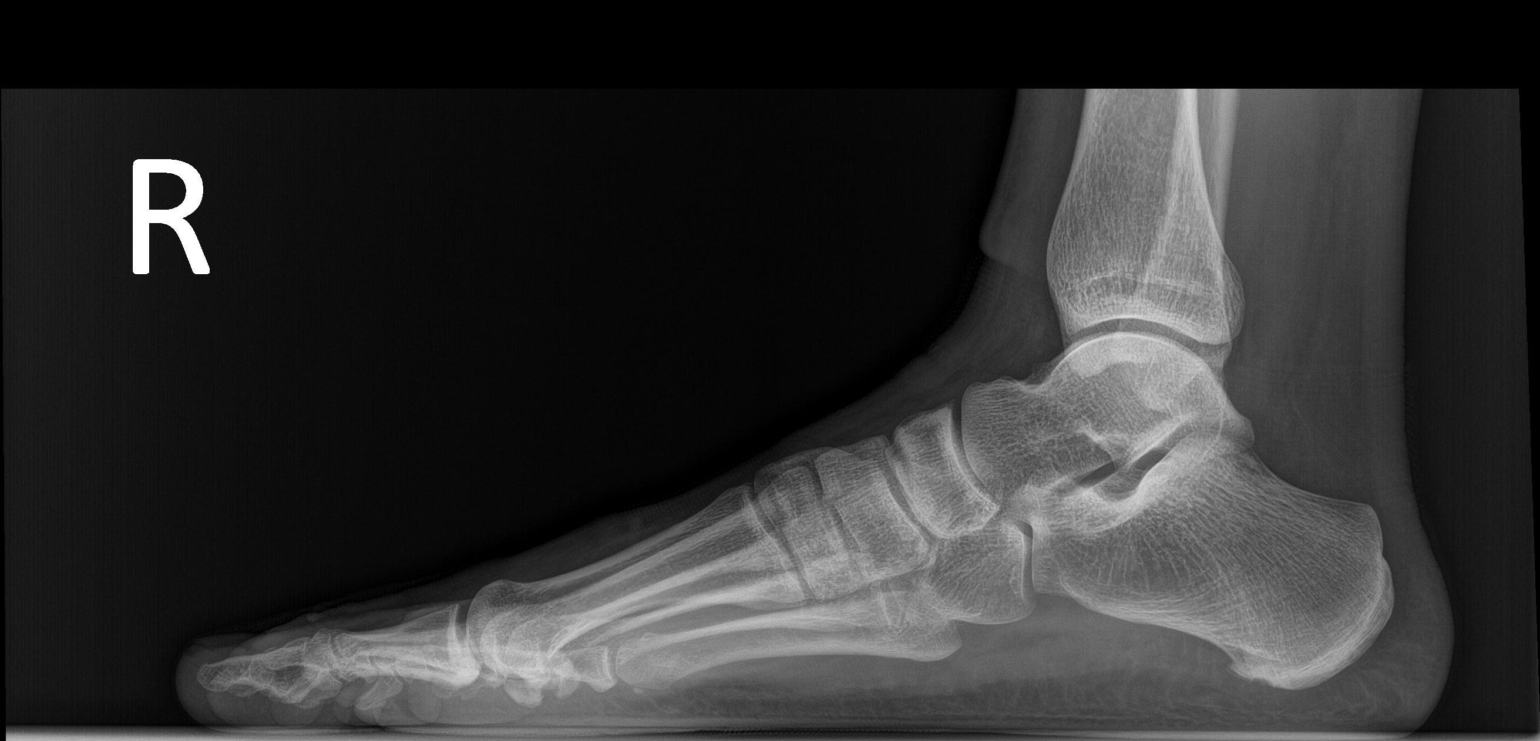

[3 of 3 positions shown; findings below may reference images not displayed]

FINDINGS: Weightbearing views. Normal bone mineralization. Normal alignment.
Tiny plantar calcaneal heel spur. Joint spaces are preserved. No
acute fracture or dislocation.
IMPRESSION: Tiny plantar calcaneal heel spur. Otherwise, normal right foot
radiographs.
